# Patient Record
Sex: Male | Born: 1975 | Race: Black or African American | Hispanic: No | Marital: Single | Smoking: Current every day smoker
Health system: Southern US, Community
[De-identification: ages and names within clinical notes are randomized; demographics above are authoritative.]

## PROBLEM LIST (undated history)

## (undated) DIAGNOSIS — Z789 Other specified health status: Secondary | ICD-10-CM

---

## 2000-10-26 ENCOUNTER — Encounter: Payer: Self-pay | Admitting: Emergency Medicine

## 2000-10-26 ENCOUNTER — Encounter: Payer: Self-pay | Admitting: *Deleted

## 2000-10-26 ENCOUNTER — Emergency Department (HOSPITAL_COMMUNITY): Admission: EM | Admit: 2000-10-26 | Discharge: 2000-10-26 | Payer: Self-pay | Admitting: *Deleted

## 2001-12-30 ENCOUNTER — Emergency Department (HOSPITAL_COMMUNITY): Admission: EM | Admit: 2001-12-30 | Discharge: 2001-12-30 | Payer: Self-pay | Admitting: Internal Medicine

## 2002-01-03 ENCOUNTER — Emergency Department (HOSPITAL_COMMUNITY): Admission: EM | Admit: 2002-01-03 | Discharge: 2002-01-03 | Payer: Self-pay | Admitting: Emergency Medicine

## 2012-05-17 ENCOUNTER — Encounter (HOSPITAL_COMMUNITY): Payer: Self-pay | Admitting: Anesthesiology

## 2012-05-17 ENCOUNTER — Encounter (HOSPITAL_COMMUNITY): Admission: EM | Disposition: A | Discharge status: Home Health | Payer: Self-pay | Source: Home / Self Care

## 2012-05-17 ENCOUNTER — Encounter (HOSPITAL_COMMUNITY): Payer: Self-pay | Admitting: Emergency Medicine

## 2012-05-17 ENCOUNTER — Inpatient Hospital Stay (HOSPITAL_COMMUNITY): Payer: No Typology Code available for payment source

## 2012-05-17 ENCOUNTER — Emergency Department (HOSPITAL_COMMUNITY): Payer: No Typology Code available for payment source

## 2012-05-17 ENCOUNTER — Inpatient Hospital Stay (HOSPITAL_COMMUNITY): Payer: No Typology Code available for payment source | Admitting: Anesthesiology

## 2012-05-17 ENCOUNTER — Inpatient Hospital Stay (HOSPITAL_COMMUNITY)
Admission: EM | Admit: 2012-05-17 | Discharge: 2012-05-22 | DRG: 516 | Disposition: A | Discharge status: Home Health | Payer: Self-pay | Attending: General Surgery | Admitting: General Surgery

## 2012-05-17 DIAGNOSIS — H2000 Unspecified acute and subacute iridocyclitis: Secondary | ICD-10-CM | POA: Diagnosis present

## 2012-05-17 DIAGNOSIS — S52609A Unspecified fracture of lower end of unspecified ulna, initial encounter for closed fracture: Secondary | ICD-10-CM | POA: Diagnosis present

## 2012-05-17 DIAGNOSIS — S52613A Displaced fracture of unspecified ulna styloid process, initial encounter for closed fracture: Secondary | ICD-10-CM

## 2012-05-17 DIAGNOSIS — S82109A Unspecified fracture of upper end of unspecified tibia, initial encounter for closed fracture: Secondary | ICD-10-CM | POA: Diagnosis present

## 2012-05-17 DIAGNOSIS — F17200 Nicotine dependence, unspecified, uncomplicated: Secondary | ICD-10-CM | POA: Diagnosis present

## 2012-05-17 DIAGNOSIS — H5704 Mydriasis: Secondary | ICD-10-CM | POA: Diagnosis present

## 2012-05-17 DIAGNOSIS — S71109A Unspecified open wound, unspecified thigh, initial encounter: Secondary | ICD-10-CM | POA: Diagnosis present

## 2012-05-17 DIAGNOSIS — IMO0002 Reserved for concepts with insufficient information to code with codable children: Secondary | ICD-10-CM

## 2012-05-17 DIAGNOSIS — H4310 Vitreous hemorrhage, unspecified eye: Secondary | ICD-10-CM | POA: Diagnosis present

## 2012-05-17 DIAGNOSIS — D62 Acute posthemorrhagic anemia: Secondary | ICD-10-CM | POA: Diagnosis not present

## 2012-05-17 DIAGNOSIS — S0592XA Unspecified injury of left eye and orbit, initial encounter: Secondary | ICD-10-CM

## 2012-05-17 DIAGNOSIS — T07XXXA Unspecified multiple injuries, initial encounter: Secondary | ICD-10-CM

## 2012-05-17 DIAGNOSIS — S82113A Displaced fracture of unspecified tibial spine, initial encounter for closed fracture: Secondary | ICD-10-CM

## 2012-05-17 DIAGNOSIS — S32409A Unspecified fracture of unspecified acetabulum, initial encounter for closed fracture: Principal | ICD-10-CM | POA: Diagnosis present

## 2012-05-17 DIAGNOSIS — S32401A Unspecified fracture of right acetabulum, initial encounter for closed fracture: Secondary | ICD-10-CM

## 2012-05-17 DIAGNOSIS — S73016A Posterior dislocation of unspecified hip, initial encounter: Secondary | ICD-10-CM | POA: Diagnosis present

## 2012-05-17 DIAGNOSIS — F10929 Alcohol use, unspecified with intoxication, unspecified: Secondary | ICD-10-CM | POA: Diagnosis present

## 2012-05-17 DIAGNOSIS — S81009A Unspecified open wound, unspecified knee, initial encounter: Secondary | ICD-10-CM | POA: Diagnosis present

## 2012-05-17 DIAGNOSIS — S73004A Unspecified dislocation of right hip, initial encounter: Secondary | ICD-10-CM

## 2012-05-17 DIAGNOSIS — F1010 Alcohol abuse, uncomplicated: Secondary | ICD-10-CM | POA: Diagnosis present

## 2012-05-17 DIAGNOSIS — S060X9A Concussion with loss of consciousness of unspecified duration, initial encounter: Secondary | ICD-10-CM

## 2012-05-17 DIAGNOSIS — S060X0A Concussion without loss of consciousness, initial encounter: Secondary | ICD-10-CM | POA: Diagnosis present

## 2012-05-17 DIAGNOSIS — S060XAA Concussion with loss of consciousness status unknown, initial encounter: Secondary | ICD-10-CM

## 2012-05-17 DIAGNOSIS — S71009A Unspecified open wound, unspecified hip, initial encounter: Secondary | ICD-10-CM | POA: Diagnosis present

## 2012-05-17 HISTORY — PX: HIP CLOSED REDUCTION: SHX983

## 2012-05-17 HISTORY — DX: Other specified health status: Z78.9

## 2012-05-17 HISTORY — PX: ORIF ACETABULAR FRACTURE: SHX5029

## 2012-05-17 LAB — CBC
HCT: 45.8 % (ref 39.0–52.0)
Hemoglobin: 12.5 g/dL — ABNORMAL LOW (ref 13.0–17.0)
MCH: 30.2 pg (ref 26.0–34.0)
MCV: 90.5 fL (ref 78.0–100.0)
Platelets: 217 10*3/uL (ref 150–400)
RBC: 5.06 MIL/uL (ref 4.22–5.81)
RBC: 4.14 MIL/uL — ABNORMAL LOW (ref 4.22–5.81)
WBC: 11.6 10*3/uL — ABNORMAL HIGH (ref 4.0–10.5)

## 2012-05-17 LAB — TYPE AND SCREEN
ABO/RH(D): A POS
Antibody Screen: NEGATIVE

## 2012-05-17 LAB — BASIC METABOLIC PANEL
CO2: 24 mEq/L (ref 19–32)
Chloride: 105 mEq/L (ref 96–112)
Creatinine, Ser: 1.05 mg/dL (ref 0.50–1.35)

## 2012-05-17 LAB — CREATININE, SERUM
Creatinine, Ser: 0.89 mg/dL (ref 0.50–1.35)
GFR calc Af Amer: >90 mL/min (ref >90)
GFR calc non Af Amer: >90 mL/min (ref >90)

## 2012-05-17 LAB — ETHANOL: Alcohol, Ethyl (B): 206 mg/dL — ABNORMAL HIGH (ref 0–11)

## 2012-05-17 SURGERY — CLOSED REDUCTION, HIP
Anesthesia: General | Site: Hip | Laterality: Right | Wound class: Clean

## 2012-05-17 MED ORDER — BISACODYL 10 MG RE SUPP: 10.0000 mg | Freq: Every day | RECTAL | Status: DC | PRN

## 2012-05-17 MED ORDER — 0.9 % SODIUM CHLORIDE (POUR BTL) OPTIME
TOPICAL | Status: DC | PRN
  Administered 2012-05-17: 1000 mL

## 2012-05-17 MED ORDER — METOCLOPRAMIDE HCL 5 MG/ML IJ SOLN: 10.0000 mg | Freq: Once | INTRAMUSCULAR | Status: DC | PRN

## 2012-05-17 MED ORDER — ONDANSETRON HCL 4 MG/2ML IJ SOLN: 4.0000 mg | Freq: Four times a day (QID) | INTRAMUSCULAR | Status: DC | PRN

## 2012-05-17 MED ORDER — MORPHINE SULFATE 4 MG/ML IJ SOLN
4.0000 mg | Freq: Once | INTRAMUSCULAR | Status: AC
  Administered 2012-05-17: 4 mg via INTRAVENOUS
  Filled 2012-05-17: qty 1

## 2012-05-17 MED ORDER — CEFAZOLIN SODIUM 1-5 GM-% IV SOLN
1.0000 g | Freq: Three times a day (TID) | INTRAVENOUS | Status: AC
  Administered 2012-05-17 – 2012-05-18 (×3): 1 g via INTRAVENOUS
  Filled 2012-05-17 (×3): qty 50

## 2012-05-17 MED ORDER — LACTATED RINGERS IV SOLN
INTRAVENOUS | Status: DC | PRN
  Administered 2012-05-17: 12:00:00 via INTRAVENOUS

## 2012-05-17 MED ORDER — OXYCODONE HCL 5 MG/5ML PO SOLN: 5.0000 mg | Freq: Once | ORAL | Status: DC | PRN

## 2012-05-17 MED ORDER — NEOSTIGMINE METHYLSULFATE 1 MG/ML IJ SOLN
INTRAMUSCULAR | Status: DC | PRN
  Administered 2012-05-17: 4 mg via INTRAVENOUS

## 2012-05-17 MED ORDER — PANTOPRAZOLE SODIUM 40 MG PO TBEC: 40.0000 mg | DELAYED_RELEASE_TABLET | Freq: Every day | ORAL | Status: DC

## 2012-05-17 MED ORDER — SODIUM CHLORIDE 0.9 % IJ SOLN: 9.0000 mL | INTRAMUSCULAR | Status: DC | PRN

## 2012-05-17 MED ORDER — METOCLOPRAMIDE HCL 5 MG/ML IJ SOLN: 5.0000 mg | Freq: Three times a day (TID) | INTRAMUSCULAR | Status: DC | PRN

## 2012-05-17 MED ORDER — METHOCARBAMOL 500 MG PO TABS
1000.0000 mg | ORAL_TABLET | Freq: Four times a day (QID) | ORAL | Status: DC
  Administered 2012-05-17 – 2012-05-22 (×19): 1000 mg via ORAL
  Filled 2012-05-17 (×30): qty 2

## 2012-05-17 MED ORDER — PANTOPRAZOLE SODIUM 40 MG IV SOLR
40.0000 mg | Freq: Every day | INTRAVENOUS | Status: DC
  Administered 2012-05-17: 40 mg via INTRAVENOUS
  Filled 2012-05-17 (×2): qty 40

## 2012-05-17 MED ORDER — HYDROMORPHONE HCL PF 1 MG/ML IJ SOLN
INTRAMUSCULAR | Status: AC
  Filled 2012-05-17: qty 1

## 2012-05-17 MED ORDER — GLYCOPYRROLATE 0.2 MG/ML IJ SOLN
INTRAMUSCULAR | Status: DC | PRN
  Administered 2012-05-17: .6 mg via INTRAVENOUS

## 2012-05-17 MED ORDER — OXYCODONE HCL 5 MG PO TABS: 5.0000 mg | ORAL_TABLET | Freq: Once | ORAL | Status: DC | PRN

## 2012-05-17 MED ORDER — ENOXAPARIN SODIUM 40 MG/0.4ML ~~LOC~~ SOLN
40.0000 mg | SUBCUTANEOUS | Status: DC
  Administered 2012-05-17 – 2012-05-21 (×5): 40 mg via SUBCUTANEOUS
  Filled 2012-05-17 (×6): qty 0.4

## 2012-05-17 MED ORDER — ARTIFICIAL TEARS OP OINT
TOPICAL_OINTMENT | OPHTHALMIC | Status: DC | PRN
  Administered 2012-05-17: 1 via OPHTHALMIC

## 2012-05-17 MED ORDER — POLYETHYLENE GLYCOL 3350 17 G PO PACK
17.0000 g | PACK | Freq: Every day | ORAL | Status: DC
  Administered 2012-05-17 – 2012-05-22 (×5): 17 g via ORAL
  Filled 2012-05-17 (×6): qty 1

## 2012-05-17 MED ORDER — OXYCODONE HCL 5 MG PO TABS: 5.0000 mg | ORAL_TABLET | ORAL | Status: DC | PRN

## 2012-05-17 MED ORDER — LIDOCAINE HCL (CARDIAC) 20 MG/ML IV SOLN
INTRAVENOUS | Status: DC | PRN
  Administered 2012-05-17: 50 mg via INTRAVENOUS

## 2012-05-17 MED ORDER — MORPHINE SULFATE 2 MG/ML IJ SOLN
2.0000 mg | Freq: Once | INTRAMUSCULAR | Status: AC
  Administered 2012-05-17: 2 mg via INTRAVENOUS
  Filled 2012-05-17: qty 1

## 2012-05-17 MED ORDER — NALOXONE HCL 0.4 MG/ML IJ SOLN: 0.4000 mg | INTRAMUSCULAR | Status: DC | PRN

## 2012-05-17 MED ORDER — ENOXAPARIN SODIUM 40 MG/0.4ML ~~LOC~~ SOLN: 40.0000 mg | SUBCUTANEOUS | Status: DC

## 2012-05-17 MED ORDER — ONDANSETRON HCL 4 MG PO TABS: 4.0000 mg | ORAL_TABLET | Freq: Four times a day (QID) | ORAL | Status: DC | PRN

## 2012-05-17 MED ORDER — PROPOFOL 10 MG/ML IV BOLUS
INTRAVENOUS | Status: DC | PRN
  Administered 2012-05-17: 200 mg via INTRAVENOUS

## 2012-05-17 MED ORDER — HYDROMORPHONE 0.3 MG/ML IV SOLN
INTRAVENOUS | Status: AC
  Filled 2012-05-17: qty 25

## 2012-05-17 MED ORDER — FENTANYL CITRATE 0.05 MG/ML IJ SOLN
INTRAMUSCULAR | Status: DC | PRN
  Administered 2012-05-17 (×5): 50 ug via INTRAVENOUS

## 2012-05-17 MED ORDER — PHENYLEPHRINE HCL 10 MG/ML IJ SOLN
INTRAMUSCULAR | Status: DC | PRN
  Administered 2012-05-17 (×2): 80 ug via INTRAVENOUS

## 2012-05-17 MED ORDER — HYDROMORPHONE HCL PF 1 MG/ML IJ SOLN
1.0000 mg | INTRAMUSCULAR | Status: DC | PRN
  Administered 2012-05-18 – 2012-05-19 (×2): 1 mg via INTRAVENOUS
  Filled 2012-05-17 (×2): qty 1

## 2012-05-17 MED ORDER — ACETAMINOPHEN 10 MG/ML IV SOLN
1000.0000 mg | Freq: Four times a day (QID) | INTRAVENOUS | Status: AC
  Administered 2012-05-17 – 2012-05-18 (×3): 1000 mg via INTRAVENOUS
  Filled 2012-05-17 (×4): qty 100

## 2012-05-17 MED ORDER — ONDANSETRON 4 MG PO TBDP
4.0000 mg | ORAL_TABLET | Freq: Once | ORAL | Status: AC
  Administered 2012-05-17: 4 mg via ORAL
  Filled 2012-05-17: qty 1

## 2012-05-17 MED ORDER — HYDROMORPHONE HCL PF 1 MG/ML IJ SOLN: 1.0000 mg | Freq: Once | INTRAMUSCULAR | Status: AC

## 2012-05-17 MED ORDER — TETANUS-DIPHTH-ACELL PERTUSSIS 5-2.5-18.5 LF-MCG/0.5 IM SUSP
0.5000 mL | Freq: Once | INTRAMUSCULAR | Status: AC
  Administered 2012-05-17: 0.5 mL via INTRAMUSCULAR
  Filled 2012-05-17: qty 0.5

## 2012-05-17 MED ORDER — ONDANSETRON HCL 4 MG/2ML IJ SOLN
INTRAMUSCULAR | Status: DC | PRN
  Administered 2012-05-17: 4 mg via INTRAVENOUS

## 2012-05-17 MED ORDER — KCL IN DEXTROSE-NACL 20-5-0.9 MEQ/L-%-% IV SOLN
INTRAVENOUS | Status: DC
  Administered 2012-05-18 (×2): via INTRAVENOUS
  Filled 2012-05-17 (×4): qty 1000

## 2012-05-17 MED ORDER — HYDROMORPHONE HCL PF 1 MG/ML IJ SOLN
INTRAMUSCULAR | Status: AC
  Administered 2012-05-17: 1 mg via INTRAVENOUS
  Filled 2012-05-17: qty 1

## 2012-05-17 MED ORDER — POTASSIUM CHLORIDE IN NACL 20-0.9 MEQ/L-% IV SOLN: INTRAVENOUS | Status: DC

## 2012-05-17 MED ORDER — VECURONIUM BROMIDE 10 MG IV SOLR
INTRAVENOUS | Status: DC | PRN
  Administered 2012-05-17: 1 mg via INTRAVENOUS
  Administered 2012-05-17 (×2): 2 mg via INTRAVENOUS

## 2012-05-17 MED ORDER — IOHEXOL 300 MG/ML  SOLN
100.0000 mL | Freq: Once | INTRAMUSCULAR | Status: AC | PRN
  Administered 2012-05-17: 100 mL via INTRAVENOUS

## 2012-05-17 MED ORDER — METOCLOPRAMIDE HCL 10 MG PO TABS: 5.0000 mg | ORAL_TABLET | Freq: Three times a day (TID) | ORAL | Status: DC | PRN

## 2012-05-17 MED ORDER — LACTATED RINGERS IV SOLN
INTRAVENOUS | Status: DC | PRN
  Administered 2012-05-17 (×2): via INTRAVENOUS

## 2012-05-17 MED ORDER — DIPHENHYDRAMINE HCL 12.5 MG/5ML PO ELIX: 12.5000 mg | ORAL_SOLUTION | Freq: Four times a day (QID) | ORAL | Status: DC | PRN

## 2012-05-17 MED ORDER — ALBUMIN HUMAN 5 % IV SOLN
INTRAVENOUS | Status: DC | PRN
  Administered 2012-05-17: 13:00:00 via INTRAVENOUS

## 2012-05-17 MED ORDER — SODIUM CHLORIDE 0.9 % IV BOLUS (SEPSIS)
1000.0000 mL | Freq: Once | INTRAVENOUS | Status: AC
  Administered 2012-05-17: 1000 mL via INTRAVENOUS

## 2012-05-17 MED ORDER — DOCUSATE SODIUM 100 MG PO CAPS
100.0000 mg | ORAL_CAPSULE | Freq: Two times a day (BID) | ORAL | Status: DC
  Administered 2012-05-17 – 2012-05-22 (×10): 100 mg via ORAL
  Filled 2012-05-17 (×12): qty 1

## 2012-05-17 MED ORDER — HYDROMORPHONE HCL PF 1 MG/ML IJ SOLN
0.2500 mg | INTRAMUSCULAR | Status: DC | PRN
  Administered 2012-05-17 (×2): 0.5 mg via INTRAVENOUS

## 2012-05-17 MED ORDER — MIDAZOLAM HCL 5 MG/5ML IJ SOLN
INTRAMUSCULAR | Status: DC | PRN
  Administered 2012-05-17: 1 mg via INTRAVENOUS

## 2012-05-17 MED ORDER — METOCLOPRAMIDE HCL 5 MG/ML IJ SOLN
INTRAMUSCULAR | Status: DC | PRN
  Administered 2012-05-17: 10 mg via INTRAVENOUS

## 2012-05-17 MED ORDER — CEFAZOLIN SODIUM-DEXTROSE 2-3 GM-% IV SOLR
INTRAVENOUS | Status: DC | PRN
  Administered 2012-05-17: 2 g via INTRAVENOUS

## 2012-05-17 MED ORDER — DIPHENHYDRAMINE HCL 12.5 MG/5ML PO ELIX: 12.5000 mg | ORAL_SOLUTION | ORAL | Status: DC | PRN

## 2012-05-17 MED ORDER — SODIUM CHLORIDE 0.9 % IV SOLN
INTRAVENOUS | Status: DC | PRN
  Administered 2012-05-17: 11:00:00 via INTRAVENOUS

## 2012-05-17 MED ORDER — ROCURONIUM BROMIDE 100 MG/10ML IV SOLN
INTRAVENOUS | Status: DC | PRN
  Administered 2012-05-17: 50 mg via INTRAVENOUS

## 2012-05-17 MED ORDER — SUCCINYLCHOLINE CHLORIDE 20 MG/ML IJ SOLN
INTRAMUSCULAR | Status: DC | PRN
  Administered 2012-05-17: 120 mg via INTRAVENOUS

## 2012-05-17 MED ORDER — DIPHENHYDRAMINE HCL 50 MG/ML IJ SOLN: 12.5000 mg | Freq: Four times a day (QID) | INTRAMUSCULAR | Status: DC | PRN

## 2012-05-17 MED ORDER — HYDROMORPHONE 0.3 MG/ML IV SOLN
INTRAVENOUS | Status: DC
  Administered 2012-05-17: 18:00:00 via INTRAVENOUS
  Administered 2012-05-18: 0.799 mg via INTRAVENOUS
  Administered 2012-05-18: 0.999 mg via INTRAVENOUS
  Administered 2012-05-18: 0.4 mg via INTRAVENOUS
  Administered 2012-05-18: 2.39 mg via INTRAVENOUS

## 2012-05-17 MED ORDER — MAGNESIUM CITRATE PO SOLN
1.0000 | Freq: Once | ORAL | Status: AC | PRN
  Filled 2012-05-17: qty 296

## 2012-05-17 MED ORDER — METHOCARBAMOL 100 MG/ML IJ SOLN: 500.0000 mg | Freq: Four times a day (QID) | INTRAVENOUS | Status: DC

## 2012-05-17 SURGICAL SUPPLY — 63 items
BIT DRILL AO MATTA 2.5MX230M (BIT) ×1 IMPLANT
BIT DRILL COUNTER SINK (DRILL) ×1 IMPLANT
BIT DRILL TWST MATTA 3.5MX195M (BIT) ×1 IMPLANT
BRUSH SCRUB DISP (MISCELLANEOUS) ×4 IMPLANT
CATH URET WHISTLE 8FR 331008 (CATHETERS) ×2 IMPLANT
CLOTH BEACON ORANGE TIMEOUT ST (SAFETY) ×2 IMPLANT
COVER SURGICAL LIGHT HANDLE (MISCELLANEOUS) ×2 IMPLANT
DRAPE C-ARM 42X72 X-RAY (DRAPES) ×2 IMPLANT
DRAPE C-ARMOR (DRAPES) ×2 IMPLANT
DRAPE INCISE IOBAN 66X45 STRL (DRAPES) ×6 IMPLANT
DRAPE ORTHO SPLIT 77X108 STRL (DRAPES) ×2
DRAPE SURG ORHT 6 SPLT 77X108 (DRAPES) ×2 IMPLANT
DRAPE U-SHAPE 47X51 STRL (DRAPES) ×2 IMPLANT
DRILL BIT AO MATTA 2.5MX230M (BIT) ×2
DRILL COUNTER SINK (DRILL) ×2
DRILL TWIST AO MATTA 3.5MX195M (BIT) ×2
DRSG MEPILEX BORDER 4X4 (GAUZE/BANDAGES/DRESSINGS) ×2 IMPLANT
DRSG MEPILEX BORDER 4X8 (GAUZE/BANDAGES/DRESSINGS) ×2 IMPLANT
DRSG PAD ABDOMINAL 8X10 ST (GAUZE/BANDAGES/DRESSINGS) ×2 IMPLANT
ELECT BLADE 4.0 EZ CLEAN MEGAD (MISCELLANEOUS) ×2
ELECT REM PT RETURN 9FT ADLT (ELECTROSURGICAL) ×2
ELECTRODE BLDE 4.0 EZ CLN MEGD (MISCELLANEOUS) ×1 IMPLANT
ELECTRODE REM PT RTRN 9FT ADLT (ELECTROSURGICAL) ×1 IMPLANT
GLOVE BIO SURGEON STRL SZ7.5 (GLOVE) ×4 IMPLANT
GLOVE BIO SURGEON STRL SZ8 (GLOVE) ×4 IMPLANT
GLOVE BIOGEL PI IND STRL 7.5 (GLOVE) ×2 IMPLANT
GLOVE BIOGEL PI IND STRL 8 (GLOVE) ×1 IMPLANT
GLOVE BIOGEL PI INDICATOR 7.5 (GLOVE) ×2
GLOVE BIOGEL PI INDICATOR 8 (GLOVE) ×1
GLOVE SURG SS PI 8.0 STRL IVOR (GLOVE) ×2 IMPLANT
GOWN PREVENTION PLUS XLARGE (GOWN DISPOSABLE) ×2 IMPLANT
GOWN PREVENTION PLUS XXLARGE (GOWN DISPOSABLE) ×2 IMPLANT
GOWN STRL NON-REIN LRG LVL3 (GOWN DISPOSABLE) ×6 IMPLANT
KIT BASIN OR (CUSTOM PROCEDURE TRAY) ×2 IMPLANT
KIT ROOM TURNOVER OR (KITS) ×2 IMPLANT
LOOP VESSEL MAXI BLUE (MISCELLANEOUS) ×2 IMPLANT
NS IRRIG 1000ML POUR BTL (IV SOLUTION) ×2 IMPLANT
PACK TOTAL JOINT (CUSTOM PROCEDURE TRAY) ×2 IMPLANT
PAD ARMBOARD 7.5X6 YLW CONV (MISCELLANEOUS) ×4 IMPLANT
PIN REDUCTION 6.0 (PIN) ×2 IMPLANT
PLATE ACET STRT 94.5M 8H (Plate) ×2 IMPLANT
PLATE PELV STRT 58.5M 4H (Plate) ×2 IMPLANT
PLATE TABULAR 3H 1/3 (Plate) ×2 IMPLANT
SCREW CORTEX ST MATTA 3.5X20 (Screw) ×6 IMPLANT
SCREW CORTEX ST MATTA 3.5X22MM (Screw) ×2 IMPLANT
SCREW CORTEX ST MATTA 3.5X30MM (Screw) ×2 IMPLANT
SCREW CORTEX ST MATTA 3.5X34MM (Screw) ×4 IMPLANT
SCREW CORTEX ST MATTA 3.5X36MM (Screw) ×4 IMPLANT
SPONGE GAUZE 4X4 12PLY (GAUZE/BANDAGES/DRESSINGS) ×2 IMPLANT
STAPLER VISISTAT 35W (STAPLE) ×2 IMPLANT
SUT ETHILON 3 0 PS 1 (SUTURE) ×4 IMPLANT
SUT FIBERWIRE #2 38 T-5 BLUE (SUTURE) ×4
SUT VIC AB 0 CT1 27 (SUTURE) ×2
SUT VIC AB 0 CT1 27XBRD ANBCTR (SUTURE) ×2 IMPLANT
SUT VIC AB 1 CT1 18XCR BRD 8 (SUTURE) ×1 IMPLANT
SUT VIC AB 1 CT1 8-18 (SUTURE) ×1
SUT VIC AB 2-0 CT1 27 (SUTURE) ×3
SUT VIC AB 2-0 CT1 TAPERPNT 27 (SUTURE) ×3 IMPLANT
SUTURE FIBERWR #2 38 T-5 BLUE (SUTURE) ×2 IMPLANT
SYR BULB IRRIGATION 50ML (SYRINGE) ×2 IMPLANT
TOWEL OR 17X24 6PK STRL BLUE (TOWEL DISPOSABLE) ×2 IMPLANT
TOWEL OR 17X26 10 PK STRL BLUE (TOWEL DISPOSABLE) ×4 IMPLANT
TRAY FOLEY CATH 14FR (SET/KITS/TRAYS/PACK) ×2 IMPLANT
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
## 2012-05-17 NOTE — Transfer of Care (Signed)
Immediate Anesthesia Transfer of Care Note  Patient: Brandon Chandler  Procedure(s) Performed: Procedure(s): CLOSED REDUCTION HIP (Right) OPEN REDUCTION INTERNAL FIXATION (ORIF) ACETABULAR FRACTURE (Right)  Patient Location: PACU  Anesthesia Type:General  Level of Consciousness: sedated  Airway & Oxygen Therapy: Patient Spontanous Breathing and Patient connected to face mask oxygen  Post-op Assessment: Report given to PACU RN and Post -op Vital signs reviewed and stable  Post vital signs: Reviewed and stable  Complications: No apparent anesthesia complications

## 2012-05-17 NOTE — Anesthesia Preprocedure Evaluation (Signed)
Anesthesia Evaluation  Patient identified by MRN, date of birth, ID band Patient awake    Reviewed: Allergy & Precautions, H&P , NPO status , Patient's Chart, lab work & pertinent test results, reviewed documented beta blocker date and time   Airway Mallampati: II TM Distance: >3 FB Neck ROM: full    Dental   Pulmonary Current Smoker,  breath sounds clear to auscultation        Cardiovascular negative cardio ROS  Rhythm:regular     Neuro/Psych negative neurological ROS  negative psych ROS   GI/Hepatic negative GI ROS, (+)     substance abuse  alcohol use,   Endo/Other  negative endocrine ROS  Renal/GU negative Renal ROS  negative genitourinary   Musculoskeletal   Abdominal   Peds  Hematology negative hematology ROS (+)   Anesthesia Other Findings See surgeon's H&P   Reproductive/Obstetrics negative OB ROS                           Anesthesia Physical Anesthesia Plan  ASA: II and emergent  Anesthesia Plan: General   Post-op Pain Management:    Induction: Intravenous  Airway Management Planned: Oral ETT and Video Laryngoscope Planned  Additional Equipment:   Intra-op Plan:   Post-operative Plan: Extubation in OR  Informed Consent: I have reviewed the patients History and Physical, chart, labs and discussed the procedure including the risks, benefits and alternatives for the proposed anesthesia with the patient or authorized representative who has indicated his/her understanding and acceptance.   Dental Advisory Given  Plan Discussed with: CRNA and Surgeon  Anesthesia Plan Comments:         Anesthesia Quick Evaluation

## 2012-05-17 NOTE — Progress Notes (Signed)
Orthopedic Tech Progress Note Patient Details:  Brandon Chandler 27-Nov-1975 [id number]  Ortho Devices Type of Ortho Device: Velcro wrist splint   Haskell Flirt 05/17/2012, 11:59 PM
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
## 2012-05-17 NOTE — Anesthesia Postprocedure Evaluation (Signed)
  Anesthesia Post-op Note  Patient: Brandon Chandler  Procedure(s) Performed: Procedure(s): CLOSED REDUCTION HIP (Right) OPEN REDUCTION INTERNAL FIXATION (ORIF) ACETABULAR FRACTURE (Right)  Patient Location: PACU  Anesthesia Type:General  Level of Consciousness: sedated, patient cooperative and responds to stimulation and voice  Airway and Oxygen Therapy: Patient Spontanous Breathing and Patient connected to nasal cannula oxygen  Post-op Pain: none  Post-op Assessment: Post-op Vital signs reviewed, Patient's Cardiovascular Status Stable, Respiratory Function Stable, Patent Airway, No signs of Nausea or vomiting and Pain level controlled  Post-op Vital Signs: Reviewed and stable  Complications: No apparent anesthesia complications

## 2012-05-17 NOTE — Progress Notes (Signed)
Orthopedic Tech Progress Note Patient Details:  Brandon Chandler 01/08/1976 [id number]  Patient ID: Brandon Chandler, male   DOB: [birth year], 37 y.o.   MRN: [id number] Trapeze bar patient helper  Brandon Chandler 05/17/2012, 7:52 PM

## 2012-05-17 NOTE — Anesthesia Procedure Notes (Signed)
Procedure Name: Intubation Date/Time: 05/17/2012 12:15 PM Performed by: Brien Mates DOBSON Pre-anesthesia Checklist: Patient identified, Emergency Drugs available, Suction available, Patient being monitored and Timeout performed Patient Re-evaluated:Patient Re-evaluated prior to inductionOxygen Delivery Method: Circle system utilized Preoxygenation: Pre-oxygenation with 100% oxygen Intubation Type: IV induction, Cricoid Pressure applied and Rapid sequence Laryngoscope size: elective Glidescope. Grade View: Grade I Tube size: 7.5 mm Number of attempts: 1 Airway Equipment and Method: Stylet and Video-laryngoscopy Placement Confirmation: ETT inserted through vocal cords under direct vision,  positive ETCO2 and breath sounds checked- equal and bilateral (neck kept in neutral alighnment during DL) Secured at: 23 cm Tube secured with: Tape Dental Injury: Teeth and Oropharynx as per pre-operative assessment

## 2012-05-17 NOTE — Preoperative (Signed)
Beta Blockers   Reason not to administer Beta Blockers:Not Applicable 
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
## 2012-05-17 NOTE — Brief Op Note (Signed)
05/17/2012  4:10 PM  PATIENT:  Brandon Chandler  37 y.o. male  PRE-OPERATIVE DIAGNOSIS:   1. Right hip dislocation 2. Transverse posterior wall acetabular fracture 3. Right tibial eminence fracture  POST-OPERATIVE DIAGNOSIS: 1. Right hip dislocation 2. Transverse posterior wall acetabular fracture 3. Right tibial eminence fracture  PROCEDURE:  Procedure(s): CLOSED REDUCTION HIP (Right) OPEN REDUCTION INTERNAL FIXATION (ORIF) ACETABULAR FRACTURE (Right) transverse posterior wall fracture 3. Aspiration right knee  SURGEON:  Surgeon(s) and Role:    * Budd Palmer, MD - Primary  PHYSICIAN ASSISTANT: Montez Morita, [facility]  ANESTHESIA:   general  EBL:  Total I/O In: 3150 [I.V.:2900; IV Piggyback:250] Out: 1200 [Urine:900; Blood:300]  BLOOD ADMINISTERED:none  DRAINS: none   LOCAL MEDICATIONS USED:  NONE  SPECIMEN:  No Specimen  DISPOSITION OF SPECIMEN:  N/A  COUNTS:  YES  TOURNIQUET:  * No tourniquets in log *  DICTATION: .Other Dictation: Dictation Number [id number]  PLAN OF CARE: Admit to inpatient   PATIENT DISPOSITION:  PACU - hemodynamically stable.   Delay start of Pharmacological VTE agent (>24hrs) due to surgical blood loss or risk of bleeding: no

## 2012-05-17 NOTE — ED Notes (Signed)
Pt to be transferred to [facility] ED.  Report given to [facility], accepting ER and next shift.
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
## 2012-05-18 ENCOUNTER — Ambulatory Visit
Admit: 2012-05-18 | Discharge: 2012-05-18 | Disposition: A | Discharge status: Home / Self Care | Payer: No Typology Code available for payment source | Attending: Radiation Oncology | Admitting: Radiation Oncology

## 2012-05-18 ENCOUNTER — Encounter: Payer: Self-pay | Admitting: Radiation Oncology

## 2012-05-18 ENCOUNTER — Encounter (HOSPITAL_COMMUNITY): Payer: Self-pay | Admitting: General Practice

## 2012-05-18 ENCOUNTER — Inpatient Hospital Stay (HOSPITAL_COMMUNITY): Payer: No Typology Code available for payment source

## 2012-05-18 ENCOUNTER — Ambulatory Visit: Payer: No Typology Code available for payment source

## 2012-05-18 ENCOUNTER — Ambulatory Visit: Payer: No Typology Code available for payment source | Admitting: Radiation Oncology

## 2012-05-18 VITALS — BP 125/76 | HR 80 | Temp 98.3°F

## 2012-05-18 DIAGNOSIS — D62 Acute posthemorrhagic anemia: Secondary | ICD-10-CM | POA: Diagnosis not present

## 2012-05-18 DIAGNOSIS — F10929 Alcohol use, unspecified with intoxication, unspecified: Secondary | ICD-10-CM | POA: Diagnosis present

## 2012-05-18 DIAGNOSIS — S32401A Unspecified fracture of right acetabulum, initial encounter for closed fracture: Secondary | ICD-10-CM

## 2012-05-18 DIAGNOSIS — S060X9A Concussion with loss of consciousness of unspecified duration, initial encounter: Secondary | ICD-10-CM

## 2012-05-18 DIAGNOSIS — S73004A Unspecified dislocation of right hip, initial encounter: Secondary | ICD-10-CM

## 2012-05-18 DIAGNOSIS — S0592XA Unspecified injury of left eye and orbit, initial encounter: Secondary | ICD-10-CM

## 2012-05-18 DIAGNOSIS — S82113A Displaced fracture of unspecified tibial spine, initial encounter for closed fracture: Secondary | ICD-10-CM

## 2012-05-18 DIAGNOSIS — S52613A Displaced fracture of unspecified ulna styloid process, initial encounter for closed fracture: Secondary | ICD-10-CM

## 2012-05-18 LAB — CBC
HCT: 33.4 % — ABNORMAL LOW (ref 39.0–52.0)
RDW: 12.9 % (ref 11.5–15.5)
WBC: 10.2 10*3/uL (ref 4.0–10.5)

## 2012-05-18 LAB — COMPREHENSIVE METABOLIC PANEL
ALT: 30 U/L (ref 0–53)
AST: 109 U/L — ABNORMAL HIGH (ref 0–37)
Albumin: 3.1 g/dL — ABNORMAL LOW (ref 3.5–5.2)
Alkaline Phosphatase: 46 U/L (ref 39–117)
Potassium: 3.7 mEq/L (ref 3.5–5.1)
Sodium: 138 mEq/L (ref 135–145)
Total Protein: 5.6 g/dL — ABNORMAL LOW (ref 6.0–8.3)

## 2012-05-18 MED ORDER — ATROPINE SULFATE 1 % OP SOLN
1.0000 [drp] | Freq: Every day | OPHTHALMIC | Status: DC
  Administered 2012-05-19 – 2012-05-22 (×4): 1 [drp] via OPHTHALMIC
  Filled 2012-05-18: qty 2

## 2012-05-18 MED ORDER — PREDNISOLONE ACETATE 1 % OP SUSP
1.0000 [drp] | Freq: Four times a day (QID) | OPHTHALMIC | Status: DC
  Administered 2012-05-19 – 2012-05-22 (×14): 1 [drp] via OPHTHALMIC
  Filled 2012-05-18: qty 1

## 2012-05-18 MED FILL — Hydromorphone HCl Preservative Free (PF) Inj 2 MG/ML: INTRAMUSCULAR | Qty: 1 | Status: AC

## 2012-05-18 NOTE — Progress Notes (Signed)
Brandon Chandler here for sterotactic treatment to right hip.  He is alert and oriented to person, place and time.  He currently rates his pain as an 8 on a 0-10 scale in his right hip.  He does have a dilaudid PCA pump for pain control.
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
## 2012-05-18 NOTE — Progress Notes (Signed)
x
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
## 2012-05-18 NOTE — Progress Notes (Signed)
UR completed 

## 2012-05-18 NOTE — Progress Notes (Addendum)
Pateint still being treated at [facility]. Not on floor when 1400 medications were due.
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
## 2012-05-18 NOTE — Progress Notes (Signed)
Going for XRT today to right hip. This patient has been seen and I agree with the findings and treatment plan.  Marta Lamas. Gae Bon, MD, FACS [phone] (pager) [phone] (direct pager) Trauma Surgeon
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
## 2012-05-19 LAB — CBC
Hemoglobin: 10.9 g/dL — ABNORMAL LOW (ref 13.0–17.0)
MCH: 30.3 pg (ref 26.0–34.0)
RBC: 3.6 MIL/uL — ABNORMAL LOW (ref 4.22–5.81)

## 2012-05-19 MED ORDER — OXYCODONE HCL 5 MG PO TABS
5.0000 mg | ORAL_TABLET | ORAL | Status: DC | PRN
  Administered 2012-05-19 – 2012-05-21 (×5): 10 mg via ORAL
  Filled 2012-05-19 (×5): qty 2

## 2012-05-19 NOTE — Progress Notes (Signed)
Subjective: 2 Days Post-Op Procedure(s) (LRB): CLOSED REDUCTION HIP (Right) OPEN REDUCTION INTERNAL FIXATION (ORIF) ACETABULAR FRACTURE (Right) Patient reports pain as mild.  +flatus, -BM  Objective: Vital signs in last 24 hours: Temp:  [98.3 F (36.8 C)] 98.3 F (36.8 C) (03/21 1204) Pulse Rate:  [80] 80 (03/21 1204) BP: (125)/(76) 125/76 mmHg (03/21 1204) SpO2:  [100 %] 100 % (03/21 1204)  Intake/Output from previous day: 03/21 0701 - 03/22 0700 In: 640 [P.O.:480; I.V.:160] Out: -  Intake/Output this shift: Total I/O In: 360 [P.O.:360] Out: -    Recent Labs  05/17/12 0425 05/17/12 1854 05/18/12 0525 05/19/12 0500  HGB 15.4 12.5* 11.3* 10.9*    Recent Labs  05/18/12 0525 05/19/12 0500  WBC 10.2 12.6*  RBC 3.80* 3.60*  HCT 33.4* 31.0*  PLT 152 141*    Recent Labs  05/17/12 0425 05/17/12 1854 05/18/12 0525  NA 144  --  138  K 3.7  --  3.7  CL 105  --  102  CO2 24  --  30  BUN 12  --  9  CREATININE 1.05 0.89 0.94  GLUCOSE 129*  --  110*  CALCIUM 9.5  --  8.4   No results found for this basename: LABPT, INR,  in the last 72 hours  Neurovascular intact Sensation intact distally Intact pulses distally Dorsiflexion/Plantar flexion intact Incision: dressing C/D/I and scant drainage No cellulitis present Compartment soft Dressing changed Abdomen nondistended  Assessment/Plan: 2 Days Post-Op Procedure(s) (LRB): CLOSED REDUCTION HIP (Right) OPEN REDUCTION INTERNAL FIXATION (ORIF) ACETABULAR FRACTURE (Right) Up with therapy TDWB RLE, NWB LUE through wrist, continue with platform walker scd's and lovenox dvt proph Pain control as needed Awaiting final read on CT L wrist continue current restrictions for now  Margart Sickles 05/19/2012, 8:21 AM
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
## 2012-05-20 ENCOUNTER — Inpatient Hospital Stay (HOSPITAL_COMMUNITY): Payer: No Typology Code available for payment source

## 2012-05-20 LAB — URINALYSIS, ROUTINE W REFLEX MICROSCOPIC
Glucose, UA: NEGATIVE mg/dL
Leukocytes, UA: NEGATIVE
Specific Gravity, Urine: 1.012 (ref 1.005–1.030)
pH: 7 (ref 5.0–8.0)

## 2012-05-20 MED ORDER — ACETAMINOPHEN 325 MG PO TABS
650.0000 mg | ORAL_TABLET | Freq: Four times a day (QID) | ORAL | Status: DC | PRN
  Administered 2012-05-20: 650 mg via ORAL
  Filled 2012-05-20 (×2): qty 2

## 2012-05-20 MED ORDER — OXYCODONE HCL 5 MG PO TABS: 15.0000 mg | ORAL_TABLET | ORAL | Status: DC | PRN
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
## 2012-05-20 NOTE — Progress Notes (Signed)
No significant change.  CIR consult.
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
## 2012-05-20 NOTE — Progress Notes (Signed)
Occupational Therapy Treatment Patient Details Name: Brandon Chandler MRN: [id number] DOB: Feb 12, 1976 Today's Date: 05/20/2012 Time: 7425-9563 OT Time Calculation (min): 15 min  OT Assessment / Plan / Recommendation Comments on Treatment Session  Pt instructed in AE for LB ADLs.  Pt requires min cues for THA precautions.  Pt. Noted to have left wrist splint off upon therapist's enterance.  Pt. Instructed in need to wear splint and precautions, but pt still did not attempt to don brace until asked a second time to do so.  Will require reinforcement for precautions    Follow Up Recommendations  No OT follow up    Barriers to Discharge       Equipment Recommendations  Tub/shower bench;3 in 1 bedside comode    Recommendations for Other Services    Frequency Min 2X/week   Plan Discharge plan remains appropriate    Precautions / Restrictions Precautions Precautions: Posterior Hip Precaution Booklet Issued: Yes (comment) Precaution Comments: Pt able to state 2/3 posterior THA precautions Restrictions Weight Bearing Restrictions: Yes LUE Weight Bearing: Non weight bearing RLE Weight Bearing: Touchdown weight bearing   Pertinent Vitals/Pain     ADL  Lower Body Dressing: Minimal assistance Where Assessed - Lower Body Dressing: Supported sit to stand ADL Comments: Pt instructed in use of AE for LB ADLs, he returned demo with min A and min verbal cues.  Pt dererred tub transfer practice at this time.  Pt requires min verbal cues to avoid bending.  Pt noted to have Lt. wrist brace off upon therapit's enterance, Reinforced need for splint, however, pt did not attempt to don, until he was asked a second time to do so    OT Diagnosis:    OT Problem List:   OT Treatment Interventions:     OT Goals ADL Goals ADL Goal: Lower Body Dressing - Progress: Progressing toward goals  Visit Information  Last OT Received On: 05/20/12 Assistance Needed: +1    Subjective Data      Prior  Functioning       Cognition  Cognition Overall Cognitive Status: Appears within functional limits for tasks assessed/performed Arousal/Alertness: Awake/alert Orientation Level: Appears intact for tasks assessed Behavior During Session: [facility] for tasks performed    Mobility       Exercises      Balance     End of Session OT - End of Session Activity Tolerance: Patient tolerated treatment well Patient left: in chair;with call bell/phone within reach;with family/visitor present  GO     Idolina Mantell M 05/20/2012, 12:16 PM
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
## 2012-05-21 DIAGNOSIS — S73006A Unspecified dislocation of unspecified hip, initial encounter: Secondary | ICD-10-CM

## 2012-05-21 DIAGNOSIS — M7989 Other specified soft tissue disorders: Secondary | ICD-10-CM

## 2012-05-21 DIAGNOSIS — S52609A Unspecified fracture of lower end of unspecified ulna, initial encounter for closed fracture: Secondary | ICD-10-CM

## 2012-05-21 DIAGNOSIS — S32409A Unspecified fracture of unspecified acetabulum, initial encounter for closed fracture: Secondary | ICD-10-CM

## 2012-05-21 LAB — CBC
HCT: 29.4 % — ABNORMAL LOW (ref 39.0–52.0)
Hemoglobin: 10.1 g/dL — ABNORMAL LOW (ref 13.0–17.0)
RBC: 3.38 MIL/uL — ABNORMAL LOW (ref 4.22–5.81)
WBC: 8.8 10*3/uL (ref 4.0–10.5)

## 2012-05-21 MED ORDER — OXYCODONE HCL 5 MG PO TABS
5.0000 mg | ORAL_TABLET | ORAL | Status: DC | PRN
  Administered 2012-05-21: 10 mg via ORAL
  Filled 2012-05-21: qty 2

## 2012-05-21 MED ORDER — TRAMADOL HCL 50 MG PO TABS
100.0000 mg | ORAL_TABLET | Freq: Four times a day (QID) | ORAL | Status: DC
  Administered 2012-05-21 – 2012-05-22 (×3): 100 mg via ORAL
  Filled 2012-05-21 (×3): qty 2

## 2012-05-21 NOTE — Progress Notes (Signed)
OT Cancellation Note  Patient Details Name: Brandon Chandler MRN: [id number] DOB: February 09, 1976   Cancelled Treatment:      Pt declined participation in treatment today. Made 2 attempts today, but pt declined due to fatigue. Will try again tomorrow   Roney Mans [facility] 05/21/2012, 2:15 PM

## 2012-05-21 NOTE — Progress Notes (Signed)
PT Cancellation Note  Patient Details Name: Brandon Chandler MRN: [id number] DOB: 02/15/1976   Cancelled Treatment:    Reason Eval/Treat Not Completed: Pain limiting ability to participate Will reattempt later as time allows    Fredrich Birks 05/21/2012, 11:20 AM 05/21/2012 Fredrich Birks PTA [phone] pager [phone] office

## 2012-05-21 NOTE — Progress Notes (Signed)
Patient still has photophobia.  Right LE swollen.  He has a positive Homan's sign.  Will get venous doppler studies to r/o DVT  This patient has been seen and I agree with the findings and treatment plan.  Marta Lamas. Gae Bon, MD, FACS [phone] (pager) [phone] (direct pager) Trauma Surgeon

## 2012-05-21 NOTE — Progress Notes (Signed)
VASCULAR LAB PRELIMINARY  PRELIMINARY  PRELIMINARY  PRELIMINARY  Right lower extremity venous Dopplers completed.    Preliminary report:  There is no DVT or SVT noted in the right lower extremity.  Eivin Mascio, RVT 05/21/2012, 12:33 PM
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
## 2012-05-21 NOTE — Progress Notes (Signed)
Rehab admissions - Evaluated for possible acute inpatient rehab admission.  Please see rehab consult done today by Dr. Riley Kill recommending [facility] therapies.  Does not meet criteria for acute inpatient rehab admission.  Call me for questions.  #[phone]
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
## 2012-05-22 MED ORDER — OXYCODONE HCL 5 MG PO TABS: 5.0000 mg | ORAL_TABLET | Freq: Four times a day (QID) | ORAL | Status: DC | PRN

## 2012-05-22 MED ORDER — ENOXAPARIN (LOVENOX) PATIENT EDUCATION KIT
PACK | Freq: Once | Status: DC
  Filled 2012-05-22: qty 1

## 2012-05-22 MED ORDER — ENOXAPARIN SODIUM 40 MG/0.4ML ~~LOC~~ SOLN: 40.0000 mg | SUBCUTANEOUS | Status: DC

## 2012-05-22 MED ORDER — TRAMADOL HCL 50 MG PO TABS: 100.0000 mg | ORAL_TABLET | Freq: Four times a day (QID) | ORAL | Status: DC

## 2012-05-22 MED ORDER — METHOCARBAMOL 500 MG PO TABS: 1000.0000 mg | ORAL_TABLET | Freq: Four times a day (QID) | ORAL | Status: DC
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
## 2012-05-22 NOTE — Progress Notes (Signed)
Pt discharged to home accompanied by family/friend. Discharge instructions and rx given and explained and pt stated understanding. Pts IV was removed prior to discharge. Pt left unit in a stable condition via wheelchair.

## 2012-05-22 NOTE — Progress Notes (Signed)
Agree with PTA.    [place], PT [phone]  

## 2012-05-22 NOTE — Progress Notes (Signed)
Patient seen and examined.  Agree with PA's note. Encouraged him to work with PT and OT today.

## 2012-05-22 NOTE — Progress Notes (Signed)
Occupational Therapy Treatment Patient Details Name: Brandon Chandler MRN: [id number] DOB: [birth year] Today's Date: 05/22/2012 Time: 4098-1191 OT Time Calculation (min): 23 min  OT Assessment / Plan / Recommendation Comments on Treatment Session Making good progress. mod I to S with ADL with use of AE/DME. Ready for D/C from OT . Will have adequate support at home. Ot signing off.    Follow Up Recommendations  No OT follow up    Barriers to Discharge       Equipment Recommendations  Tub/shower bench;3 in 1 bedside comode    Recommendations for Other Services    Frequency   D/C OT  Plan Discharge plan remains appropriate    Precautions / Restrictions Precautions Precautions: Posterior Hip Precaution Booklet Issued: Yes (comment) Precaution Comments: Pt able to state 2/3 posterior THA precautions Required Braces or Orthoses: Other Brace/Splint Restrictions LUE Weight Bearing: Non weight bearing RLE Weight Bearing: Touchdown weight bearing   Pertinent Vitals/Pain General c/o pain. Premedicated.    ADL  Lower Body Bathing: Set up;Supervision/safety Where Assessed - Lower Body Bathing: Unsupported sit to stand Lower Body Dressing: Set up;Supervision/safety Where Assessed - Lower Body Dressing: Unsupported sit to stand Toilet Transfer: Supervision/safety Transfers/Ambulation Related to ADLs: S RW level with platform ADL Comments: Pt able to state 3/3 precautions but requires reinforcement    OT Diagnosis:    OT Problem List:   OT Treatment Interventions:     OT Goals Acute Rehab OT Goals OT Goal Formulation: With patient Time For Goal Achievement: 05/26/12 Potential to Achieve Goals: Good ADL Goals Pt Will Perform Grooming: with supervision;Standing at sink ADL Goal: Grooming - Progress: Met Pt Will Perform Lower Body Bathing: with supervision;Sit to stand from chair;Sit to stand from bed;with adaptive equipment ADL Goal: Lower Body Bathing - Progress: Met Pt  Will Perform Lower Body Dressing: with supervision;Sit to stand from chair;Sit to stand from bed;with adaptive equipment ADL Goal: Lower Body Dressing - Progress: Met Pt Will Transfer to Toilet: with supervision;Ambulation;3-in-1 ADL Goal: Toilet Transfer - Progress: Met Pt Will Perform Toileting - Clothing Manipulation: with supervision;Standing ADL Goal: Toileting - Clothing Manipulation - Progress: Met Pt Will Perform Tub/Shower Transfer: with min assist;Transfer tub bench;Ambulation;Maintaining hip precautions ADL Goal: Tub/Shower Transfer - Progress: Met  Visit Information  Last OT Received On: 05/22/12 Assistance Needed: +1    Subjective Data      Prior Functioning       Cognition  Cognition Overall Cognitive Status: Appears within functional limits for tasks assessed/performed    Mobility  Transfers Transfers: Sit to Stand;Stand to Sit Sit to Stand: 5: Supervision Stand to Sit: 5: Supervision Details for Transfer Assistance: vc for precuations    Exercises      Balance Balance Balance Assessed:  (WFL for ADL)   End of Session OT - End of Session Activity Tolerance: Patient tolerated treatment well Patient left: Other (comment) (with PT) Nurse Communication: Mobility status  GO     Jose Alleyne,HILLARY 05/22/2012, 1:45 PM [facility], OTR/L  [phone] 05/22/2012

## 2012-05-22 NOTE — Progress Notes (Signed)
Patient has HHPT ordered and will be arranged with [facility] per pt choice and insurance coverage limitations. DME will also come from [facility].  Home address is [address], [place].  Best phone number to call patient is [phone].  Asked patient specifically if he could pay to fill his prescriptions and he said he should be able to do so.  Explained that the blood thinner injection alone could be upwards of $75.  He said that should not be a problem. Will not do MATCH program for him at this time.
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
## 2012-05-24 NOTE — Discharge Summary (Signed)
Agree with summary. 
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
## 2012-05-31 NOTE — Progress Notes (Signed)
I have seen and examined the patient. I agree with the findings above.  Budd Palmer, MD  8:18 AM

## 2012-06-06 ENCOUNTER — Ambulatory Visit: Payer: Self-pay | Admitting: Ophthalmology

## 2012-07-04 ENCOUNTER — Ambulatory Visit: Payer: Self-pay | Admitting: Ophthalmology

## 2012-09-17 ENCOUNTER — Ambulatory Visit (HOSPITAL_COMMUNITY)
Admission: RE | Admit: 2012-09-17 | Discharge: 2012-09-17 | Disposition: A | Discharge status: Home / Self Care | Payer: BC Managed Care – PPO | Source: Ambulatory Visit | Attending: Orthopedic Surgery | Admitting: Orthopedic Surgery

## 2012-09-17 DIAGNOSIS — M25559 Pain in unspecified hip: Secondary | ICD-10-CM | POA: Insufficient documentation

## 2012-09-17 DIAGNOSIS — G8929 Other chronic pain: Secondary | ICD-10-CM | POA: Insufficient documentation

## 2012-09-17 DIAGNOSIS — R262 Difficulty in walking, not elsewhere classified: Secondary | ICD-10-CM | POA: Insufficient documentation

## 2012-09-17 DIAGNOSIS — M6281 Muscle weakness (generalized): Secondary | ICD-10-CM | POA: Insufficient documentation

## 2012-09-17 DIAGNOSIS — IMO0001 Reserved for inherently not codable concepts without codable children: Secondary | ICD-10-CM | POA: Insufficient documentation
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
## 2012-09-21 ENCOUNTER — Ambulatory Visit (HOSPITAL_COMMUNITY)
Admission: RE | Admit: 2012-09-21 | Discharge: 2012-09-21 | Disposition: A | Discharge status: Home / Self Care | Payer: BC Managed Care – PPO | Source: Ambulatory Visit | Attending: Orthopedic Surgery | Admitting: Orthopedic Surgery
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
## 2012-09-25 ENCOUNTER — Ambulatory Visit (HOSPITAL_COMMUNITY)
Admission: RE | Admit: 2012-09-25 | Discharge: 2012-09-25 | Disposition: A | Discharge status: Home / Self Care | Payer: BC Managed Care – PPO | Source: Ambulatory Visit | Attending: Orthopedic Surgery | Admitting: Orthopedic Surgery
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
## 2012-09-27 ENCOUNTER — Ambulatory Visit (HOSPITAL_COMMUNITY)
Admission: RE | Admit: 2012-09-27 | Discharge: 2012-09-27 | Disposition: A | Discharge status: Home / Self Care | Payer: BC Managed Care – PPO | Source: Ambulatory Visit | Attending: Orthopedic Surgery | Admitting: Orthopedic Surgery
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
## 2012-10-02 ENCOUNTER — Ambulatory Visit (HOSPITAL_COMMUNITY)
Admission: RE | Admit: 2012-10-02 | Discharge: 2012-10-02 | Disposition: A | Discharge status: Home / Self Care | Payer: BC Managed Care – PPO | Source: Ambulatory Visit | Attending: Orthopedic Surgery | Admitting: Orthopedic Surgery

## 2012-10-02 DIAGNOSIS — M25559 Pain in unspecified hip: Secondary | ICD-10-CM | POA: Insufficient documentation

## 2012-10-02 DIAGNOSIS — R262 Difficulty in walking, not elsewhere classified: Secondary | ICD-10-CM | POA: Insufficient documentation

## 2012-10-02 DIAGNOSIS — M6281 Muscle weakness (generalized): Secondary | ICD-10-CM | POA: Insufficient documentation

## 2012-10-02 DIAGNOSIS — G8929 Other chronic pain: Secondary | ICD-10-CM | POA: Insufficient documentation

## 2012-10-02 DIAGNOSIS — IMO0001 Reserved for inherently not codable concepts without codable children: Secondary | ICD-10-CM | POA: Insufficient documentation
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
## 2012-10-04 ENCOUNTER — Ambulatory Visit (HOSPITAL_COMMUNITY)
Admission: RE | Admit: 2012-10-04 | Discharge: 2012-10-04 | Disposition: A | Discharge status: Home / Self Care | Payer: BC Managed Care – PPO | Source: Ambulatory Visit | Attending: Orthopedic Surgery | Admitting: Orthopedic Surgery
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
## 2012-10-09 ENCOUNTER — Inpatient Hospital Stay (HOSPITAL_COMMUNITY): Admission: RE | Admit: 2012-10-09 | Payer: Self-pay | Source: Ambulatory Visit | Admitting: Physical Therapy

## 2012-10-11 ENCOUNTER — Ambulatory Visit (HOSPITAL_COMMUNITY)
Admission: RE | Admit: 2012-10-11 | Discharge: 2012-10-11 | Disposition: A | Discharge status: Home / Self Care | Payer: BC Managed Care – PPO | Source: Ambulatory Visit | Attending: Orthopedic Surgery | Admitting: Orthopedic Surgery

## 2012-12-19 ENCOUNTER — Ambulatory Visit: Payer: Self-pay | Admitting: Ophthalmology

## 2013-05-08 ENCOUNTER — Ambulatory Visit: Payer: Self-pay | Admitting: Ophthalmology
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
## 2014-06-20 NOTE — Op Note (Signed)
PATIENT NAME:  Brandon Chandler, Binnie MR#:  [id number] DATE OF BIRTH:  June 06, 1975  DATE OF PROCEDURE:  07/04/2012  PROCEDURES PERFORMED: 1.  Pars plana vitrectomy of the left eye. 2.  Scleral buckle of the left eye.  3.  Epiretinal membrane peel of the left eye.  4.  Endolaser of the left eye.  5.  Air gas exchange of the left eye.  6.  Silicone oil to the left eye.   PREOPERATIVE DIAGNOSES: 1.  Dense vitreous hemorrhage.  2.  Retinal detachment.   POSTOPERATIVE DIAGNOSES: 1.  Dense vitreous hemorrhage.  2.  Tractional retinal detachment.  3.  Proliferative vitreal retinopathy.   ESTIMATED BLOOD LOSS: Less than 1 mL   PRIMARY SURGEON:  Cline CoolsMatthew F Tanekia Ryans, M.D.   ESTIMATED BLOOD LOSS: Less than one.   ANESTHESIA: General endotracheal anesthesia with a supplemental retrobulbar block.   INDICATIONS:  This is a patient who presented to my office with loss of vision in the left eye with a fovea involving retinal detachment on ultrasound. The patient was noted to have had loss of vision anywhere from 2 to 3 weeks. The patient wished to delay surgery due to financial issues. After risks, benefits, and alternatives of doing so were discussed, the patient wished to proceed at this time on 07/04/2012.   DETAILS OF PROCEDURE: After informed consent was obtained, the patient was brought into the operative suite at [facility]. The patient was placed in supine position, was induced by the anesthesia team without complications. A retrobulbar block was performed on the left eye by the primary surgeon without any complication. The left eye was prepped and draped in sterile manner. After lid speculum was inserted, a 360 degree conjunctival peritomy was created. A Tenon capsule was incised off of the globe in all four quadrants. Each of the four rectus muscles were isolated using 4 - 0 silks with special care taken to ensure that the obliques were not involved.  A 5-0 nylon stitch was  placed in a horizontal mattress fashion with the anterior portion of the mattress being 4.5 mm beyond the limbus and the posterior portion being 4.5 mm beyond that. This was done in each of the four quadrants. A #42 encircling band was placed circumferentially around the globe with the ends brought together in the inferonasal quadrant. The ends were brought together using a 70 sleeve and pulled up tight. Each of the 4 nylon sutures were tied and the knots were rotated to the posterior aspect of the globe. Height of the buckle was adjusted minimally and the ends were trimmed. Attention was turned to the pars plana vitrectomy portion of the case.   A 23-gauge trocar was placed inferotemporally 4 mm beyond the limbus in an oblique fashion. The infusion cannula was turned on and then the vitreous cutter was inserted through the trocar and a small amount of vitreous was trimmed away in order to decompress the eye. The infusion cannula was inserted through the trocar and secured in position with Steri-Strips. Two more trocars were placed in a similar fashion superotemporally and superonasally. The vitreous cutter and light pipe were introduced in the eye and a core vitrectomy was performed.  Extreme care was taken with trimming of the vitreous from anterior to posterior until the retina was visualized. Vitreous was trimmed for 360 degrees as much as possible to remove as much blood as possible without removing retina. The inferior retina was examined and a significant amount of proliferative vitreoretinopathy was  present surrounding a tear next to the buckle. This created a tractional retinal detachment that involved the fovea. There appeared to be a rupture of blood from the subretinal space into the preretinal space in the area of the fovea and nasally. These areas of old blood were trimmed away. The proliferative vitreoretinopathy was peeled from the optic nerve out to the area of the tear. At the level of the  buckle, the proliferative vitreoretinopathy was so dense that it effectively became rope.  It was determined that it was not possible to peel without elevating the entire retina. It should be noted that the retinal detachment was isolated from 4 o'clock to 7 o'clock.  Once the proliferative vitreoretinopathy was peeled off of this area an Endocutter was introduced to create a space for a retinotomy. The vitreous cutter was introduced and a relaxing retinotomy was created at that area. Perfluoron was injected to flatten the retina. It should be noted that prior to this, the proliferative vitreoretinopathy was peeled away from the fovea and the fovea relaxed; however, the proliferative vitreoretinopathy created significant folds in the retina. These seemed to relax somewhat. There were no further signs of preretinal or subretinal proliferative vitreoretinopathy that was accessible. Once the Perfluoron was in, the retina was completely flattened. Endolaser was introduced and five rows of laser were placed around the retinotomy. This was taken out to the ora serrata on either side of the retinal detachment. The endolaser was carried for 360 degrees around the scleral buckle. An  air-fluid exchange was completed above the area of the Perfluoron. A few minutes was allowed to elapse for dehydration of the vitreous base. This remnant fluid was removed. The Perfluoron was removed and the retinotomy was noted to be completely flat with no slippage or folds. Some supplemental laser was placed at the edges, 5000 centistoke silicone oil was then used once the Perfluoron was carefully removed. The oil was brought up to the level of the crystalline lens and pressure in the eye was confirmed to be approximately 15 mmHg.  Each of the trocars were removed and the sclerotomies were closed using 7-0 Vicryl. The buckle was rinsed using neomycin and the ends the ends of the buckle were trimmed. A supplemental lidocaine block was given.  The bridle sutures were removed and the conjunctiva was pulled up. The conjunctiva was closed using running 6 - 0 plain gut. Approximately 9 mg of dexamethasone was given in the inferior fornix. The lid speculum was removed and the eye was cleaned. Cosopt then TobraDex was placed onto the eye. A patch and shield were placed over the eye and the patient was reversed from anesthesia. The patient was taken to postanesthesia care with instruction to remain either facedown or on his right side.    ____________________________ Ignacia Felling. Champ Mungo, MD mfa:rw D: 07/04/2012 11:30:21 ET T: 07/04/2012 12:17:06 ET JOB#: [id number]  cc: Ignacia Felling. Champ Mungo, MD, <Dictator> Cline Cools MD ELECTRONICALLY SIGNED 07/13/2012 13:06

## 2014-06-20 NOTE — Op Note (Signed)
PATIENT NAME:  Brandon Chandler, Brandon Chandler MR#:  [id number] DATE OF BIRTH:  [birth year]  DATE OF PROCEDURE:  12/19/2012  PROCEDURES PERFORMED:  1.  Pars plana vitrectomy of the left eye.  2.  Silicone oil removal of the left eye.  3.  Focal retinal laser of the left eye.   PREOPERATIVE DIAGNOSIS: Retained silicone oil.   POSTOPERATIVE DIAGNOSES:  1.  Retained silicone oil.  2.  Retinal tear and proliferative vitreoretinopathy.   PRIMARY SURGEON: Aron BabaMatthew Leva Baine, M.D.   ANESTHESIA: Retrobulbar block of the left eye with monitored anesthesia care.   COMPLICATIONS: None.   INDICATIONS FOR PROCEDURE: The patient underwent total RD repair several months ago. Postoperative period has been unremarkable and the patient has done well. The patient had retained silicone oil and the risks, benefits and alternatives of the above procedure were discussed and the patient wished to proceed.   DETAILS OF PROCEDURE: After informed consent was obtained, the patient was brought into the operative suite at [facility]. The patient was placed in supine position, was given a small dose of Alfenta and a retrobulbar block was performed on the left eye by the primary surgeon without any complications. The left eye was prepped and draped in sterile manner. After lid speculum was inserted, a small subconjunctival wound was created inferonasally a subtenons tract was created. A supplemental retrobulbar block was performed via this tract. A 25-gauge trocar was placed inferotemporally through displaced conjunctiva 4 mm beyond the limbus in the inferotemporal quadrant. The infusion cannula was turned on and inserted through the trocar and secured in position with Steri-Strips. An L-shaped conjunctival peritomy was created superotemporally and a 20-gauge sclerotomy was created at this site. The silicone oil extractor was inserted and the silicone oil was removed. A third trocar was placed superonasally 4 mm  beyond the limbus through displaced conjunctiva in an oblique fashion. The light pipe was introduced in the eye and inferiorly was noted to have a significant proliferative vitreoretinopathy and some elevation of the retina that had not broken through a thin boundary of laser. Endolaser was introduced and 3 more rows of laser were placed beyond the original laser in order to provide protection. A partial air-fluid exchange was performed and the trocars were removed. The wounds were noted to seal. The superotemporal sclerotomy was closed using 7-0 Vicryl just prior to removal of the other trocars. All the wounds were noted to be airtight. Dexamethasone 5 mg was given into the inferior fornix. The conjunctiva was closed over the superotemporal sclerotomy with running 6-0 plain gut. The eye was pressurized to approximately 15 mmHg and the lid speculum was removed. The eye was cleaned and TobraDex was placed in the eye. A patch and shield were placed over the eye. The patient was taken to postanesthesia care with instruction to remain head up.    ____________________________ Ignacia FellingMatthew F. Champ MungoAppenzeller, MD mfa:aw D: 12/19/2012 10:47:44 ET T: 12/19/2012 10:59:43 ET JOB#: [id number]  cc: Ignacia FellingMatthew F. Champ MungoAppenzeller, MD, <Dictator> Cline CoolsMATTHEW F Wendy Hoback MD ELECTRONICALLY SIGNED 12/26/2012 7:10

## 2014-06-21 NOTE — Op Note (Signed)
PATIENT NAME:  Brandon Chandler, Denise MR#:  [id number] DATE OF BIRTH:  09/24/1975  DATE OF PROCEDURE:  05/08/2013  PREOPERATIVE DIAGNOSIS:  Cataract of the left eye.  POSTOPERATIVE DIAGNOSIS:  Cataract of the left eye.  PROCEDURE:  Phacoemulsification with posterior chamber intraocular lens implantation of the left eye.  LENS:  ZCB00 19.5-diopter posterior chamber intraocular lens.  ULTRASOUND TIME:  22% of 38 seconds.  CDE 8.3.  SURGEON:  Italyhad Porter Moes, MD  ANESTHESIA:  Retrobulbar block of Xylocaine and Bupivacaine.  COMPLICATIONS:  None.  DESCRIPTION OF PROCEDURE:  The patient was identified in the holding room and transported to the operating room and placed in the supine position under the operating microscope.  The left eye was identified as the operative eye and a retrobulbar block was performed under intravenous sedation.  It was then prepped and draped in the usual sterile ophthalmic fashion.  A 1 millimeter clear-corneal paracentesis was made at the 1:30 position.  The anterior chamber was filled with Viscoat  viscoelastic.  A 2.4 millimeter keratome was used to make a near-clear corneal incision at the 10:30 position.  A curvilinear capsulorrhexis was made with a cystotome and capsulorrhexis forceps.  Balanced salt solution was used to hydrodissect and hydrodelineate the nucleus.  Phacoemulsification was then used in stop and chop fashion to remove the lens nucleus and epinucleus.  The remaining cortex was then removed using the irrigation and aspiration handpiece.  Provisc was then placed into the capsular bag to distend it for lens placement.  A ZCB00 19.5-diopter lens was then injected into the capsular bag.  The remaining viscoelastic was aspirated.  Wounds were hydrated with balanced salt solution.  The anterior chamber was inflated to a physiologic pressure with balanced salt solution. 0.1 mL of cefuroxime 10 mg/mL were injected into the anterior chamber for a dose of 1 mg of  intracameral antibiotic at the completion of the case.  Mio stat was placed into the anterior chamber to constrict the pupil.  No wound leaks were noted.  Topical Vigamox drops and Maxitrol ointment were applied to the eye.  The eye was patched.  The patient was taken to the recovery room in stable condition without complications of anesthesia or surgery.  ____________________________ Deirdre Evenerhadwick R. Sanay Belmar, MD crb:cs D: 05/08/2013 14:53:13 ET T: 05/08/2013 15:16:47 ET JOB#: [id number]  cc: Deirdre Evenerhadwick R. Ryllie Nieland, MD, <Dictator> Lockie MolaHADWICK Feras Gardella MD ELECTRONICALLY SIGNED 05/15/2013 11:58

## 2014-07-11 ENCOUNTER — Other Ambulatory Visit
Admission: RE | Admit: 2014-07-11 | Discharge: 2014-07-11 | Disposition: A | Discharge status: Home / Self Care | Payer: 59 | Source: Ambulatory Visit | Attending: Ophthalmology | Admitting: Ophthalmology

## 2014-07-11 DIAGNOSIS — H2012 Chronic iridocyclitis, left eye: Secondary | ICD-10-CM | POA: Insufficient documentation

## 2014-07-11 LAB — ACETAMINOPHEN LEVEL: Acetaminophen (Tylenol), Serum: <10 ug/mL — ABNORMAL LOW (ref 10–30)

## 2014-07-15 LAB — QUANTIFERON IN TUBE
QFT TB AG MINUS NIL VALUE: 0 IU/mL
QUANTIFERON TB AG VALUE: 0.03 IU/mL
QUANTIFERON TB GOLD: NEGATIVE
Quantiferon Nil Value: 0.03 IU/mL

## 2014-07-15 LAB — FLUORESCENT TREPONEMAL AB(FTA)-IGG-BLD: FLUORESCENT TREPONEMAL AB, IGG: NONREACTIVE

## 2014-07-15 LAB — HIV ANTIBODY (ROUTINE TESTING W REFLEX): HIV Screen 4th Generation wRfx: NONREACTIVE

## 2014-07-15 LAB — QUANTIFERON TB GOLD ASSAY (BLOOD)

## 2014-07-16 LAB — MISC LABCORP TEST (SEND OUT): Labcorp test code: 6924

## 2021-09-09 ENCOUNTER — Emergency Department (HOSPITAL_COMMUNITY)
Admission: EM | Admit: 2021-09-09 | Discharge: 2021-09-09 | Disposition: A | Discharge status: Home / Self Care | Payer: Self-pay | Attending: Emergency Medicine | Admitting: Emergency Medicine

## 2021-09-09 ENCOUNTER — Emergency Department (HOSPITAL_COMMUNITY): Payer: Self-pay

## 2021-09-09 ENCOUNTER — Encounter (HOSPITAL_COMMUNITY): Payer: Self-pay

## 2021-09-09 ENCOUNTER — Other Ambulatory Visit: Payer: Self-pay

## 2021-09-09 DIAGNOSIS — S169XXA Unspecified injury of muscle, fascia and tendon at neck level, initial encounter: Secondary | ICD-10-CM | POA: Diagnosis present

## 2021-09-09 DIAGNOSIS — M549 Dorsalgia, unspecified: Secondary | ICD-10-CM | POA: Insufficient documentation

## 2021-09-09 DIAGNOSIS — Y92410 Unspecified street and highway as the place of occurrence of the external cause: Secondary | ICD-10-CM | POA: Diagnosis not present

## 2021-09-09 DIAGNOSIS — S161XXA Strain of muscle, fascia and tendon at neck level, initial encounter: Secondary | ICD-10-CM | POA: Diagnosis not present

## 2021-09-09 MED ORDER — KETOROLAC TROMETHAMINE 30 MG/ML IJ SOLN
30.0000 mg | Freq: Once | INTRAMUSCULAR | Status: AC
  Administered 2021-09-09: 30 mg via INTRAMUSCULAR
  Filled 2021-09-09: qty 1

## 2021-09-09 MED ORDER — NAPROXEN 500 MG PO TABS: 500.0000 mg | ORAL_TABLET | Freq: Two times a day (BID) | ORAL | 0 refills | Status: DC

## 2021-09-09 MED ORDER — OXYCODONE-ACETAMINOPHEN 5-325 MG PO TABS
1.0000 | ORAL_TABLET | Freq: Once | ORAL | Status: AC
  Administered 2021-09-09: 1 via ORAL
  Filled 2021-09-09: qty 1

## 2021-09-09 MED ORDER — METHOCARBAMOL 500 MG PO TABS: 500.0000 mg | ORAL_TABLET | Freq: Two times a day (BID) | ORAL | 0 refills | Status: DC
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
## 2021-09-09 NOTE — ED Notes (Signed)
Dc instructions and scripts reviewed with pt no questions or concerns at this time.  

## 2023-05-19 ENCOUNTER — Ambulatory Visit
Admission: RE | Admit: 2023-05-19 | Discharge: 2023-05-19 | Disposition: A | Discharge status: Home / Self Care | Payer: Self-pay | Source: Ambulatory Visit | Attending: Family Medicine | Admitting: Family Medicine

## 2023-05-19 VITALS — BP 128/82 | HR 77 | Temp 98.8°F | Resp 18

## 2023-05-19 DIAGNOSIS — L720 Epidermal cyst: Secondary | ICD-10-CM

## 2023-05-19 MED ORDER — DOXYCYCLINE HYCLATE 100 MG PO CAPS: 100.0000 mg | ORAL_CAPSULE | Freq: Two times a day (BID) | ORAL | 0 refills | Status: AC

## 2023-05-19 NOTE — ED Provider Notes (Signed)
 [facility]   [id number] 05/19/23 Arrival Time: 1650  ASSESSMENT & PLAN:  1. Epidermoid cyst of neck     Incision and Drainage Procedure Note  Anesthesia: 1% plain lidocaine  Procedure Details  The procedure, risks and complications have been discussed in detail (including, but not limited to pain and bleeding) with the patient.  The skin induration was prepped and draped in the usual fashion. After adequate local anesthesia, I&D with a #11 blade was performed on patient's left neck with purulent, cystic material  drainage.  EBL: minimal Drains: none Packing: 1/4" iodoform Condition: Tolerated procedure well Complications: none.  Meds ordered this encounter  Medications   doxycycline (VIBRAMYCIN) 100 MG capsule    Sig: Take 1 capsule (100 mg total) by mouth 2 (two) times daily.    Dispense:  14 capsule    Refill:  0   Wound care instructions discussed and given in written format. To return in 48 hours for wound check and packing removal..  Finish all antibiotics. OTC analgesics as needed.  Reviewed expectations re: course of current medical issues. Questions answered. Outlined signs and symptoms indicating need for more acute intervention. Patient verbalized understanding. After Visit Summary given.   SUBJECTIVE:  Brandon Chandler is a 48 y.o. male who presents with a possible infection of his LEFT neck. Onset gradual, approximately 2 weeks ago without active drainage and without active bleeding. Symptoms have gradually worsened since beginning. Fever: absent.  No tx PTA.   OBJECTIVE:  Vitals:   05/19/23 1719  BP: 128/82  Pulse: 77  Resp: 18  Temp: 98.8 F (37.1 C)  TempSrc: Oral  SpO2: 98%     General appearance: alert; no distress Neck: approx 1.5 cm induration of his LEFT inferior neck with mild overlying erythema; tender to touch; no active drainage or bleeding Psychological: alert and cooperative; normal mood and affect  No Known  Allergies  Past Medical History:  Diagnosis Date   Medical history non-contributory    Social History   Socioeconomic History   Marital status: Single    Spouse name: Not on file   Number of children: Not on file   Years of education: Not on file   Highest education level: Not on file  Occupational History   Not on file  Tobacco Use   Smoking status: Every Day    Current packs/day: 0.50    Average packs/day: 0.5 packs/day for 15.0 years (7.5 ttl pk-yrs)    Types: Cigarettes   Smokeless tobacco: Never  Substance and Sexual Activity   Alcohol use: Yes    Comment: OCCASIONAL "   Drug use: No   Sexual activity: Not on file  Other Topics Concern   Not on file  Social History Narrative   Not on file   Social Drivers of Health   Financial Resource Strain: Not on file  Food Insecurity: Not on file  Transportation Needs: Not on file  Physical Activity: Not on file  Stress: Not on file  Social Connections: Not on file   History reviewed. No pertinent family history. Past Surgical History:  Procedure Laterality Date   HIP CLOSED REDUCTION Right 05/17/2012   HIP CLOSED REDUCTION Right 05/17/2012   Procedure: CLOSED REDUCTION HIP;  Surgeon: Budd Palmer, MD;  Location: [facility] OR;  Service: Orthopedics;  Laterality: Right;   ORIF ACETABULAR FRACTURE Right 05/17/2012   Procedure: OPEN REDUCTION INTERNAL FIXATION (ORIF) ACETABULAR FRACTURE;  Surgeon: Budd Palmer, MD;  Location: [facility] OR;  Service: Orthopedics;  Laterality: Right;            Mardella Layman, MD 05/19/23 1850

## 2023-05-19 NOTE — ED Triage Notes (Signed)
 Pt reports he has had reoccurring abscesses on his body and this time it is on the left side of his neck x 2 weeks.
